# Patient Record
Sex: Male | Born: 2000 | Race: White | Hispanic: No | Marital: Single | Smoking: Never smoker
Health system: Southern US, Community
[De-identification: ages and names within clinical notes are randomized; demographics above are authoritative.]

## PROBLEM LIST (undated history)

## (undated) DIAGNOSIS — J45909 Unspecified asthma, uncomplicated: Secondary | ICD-10-CM

## (undated) HISTORY — PX: NO PAST SURGERIES: SHX2092

## (undated) HISTORY — DX: Unspecified asthma, uncomplicated: J45.909

---
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
## 2022-02-17 ENCOUNTER — Other Ambulatory Visit: Payer: Self-pay

## 2022-02-17 ENCOUNTER — Encounter (INDEPENDENT_AMBULATORY_CARE_PROVIDER_SITE_OTHER): Payer: 59 | Admitting: Sports Medicine

## 2022-02-17 DIAGNOSIS — F411 Generalized anxiety disorder: Secondary | ICD-10-CM

## 2022-02-17 MED ORDER — SERTRALINE HCL 100 MG PO TABS: 100.0000 mg | ORAL_TABLET | Freq: Every day | ORAL | 3 refills | Status: DC

## 2022-02-17 MED ORDER — SERTRALINE HCL 100 MG PO TABS: 100.0000 mg | ORAL_TABLET | Freq: Every day | ORAL | 0 refills | Status: DC

## 2022-02-17 NOTE — Telephone Encounter (Signed)
I spent 5 total minutes of online digital evaluation and management services in this patient-initiated request for online care. 

## 2022-02-19 ENCOUNTER — Ambulatory Visit: Payer: Self-pay | Admitting: Sports Medicine

## 2022-03-03 ENCOUNTER — Ambulatory Visit: Payer: 59 | Admitting: Sports Medicine

## 2022-03-03 ENCOUNTER — Encounter: Payer: Self-pay | Admitting: Sports Medicine

## 2022-03-03 VITALS — BP 137/89 | HR 81 | Ht 71.0 in

## 2022-03-03 DIAGNOSIS — Z0000 Encounter for general adult medical examination without abnormal findings: Secondary | ICD-10-CM | POA: Diagnosis not present

## 2022-03-03 DIAGNOSIS — J4540 Moderate persistent asthma, uncomplicated: Secondary | ICD-10-CM

## 2022-03-03 DIAGNOSIS — F411 Generalized anxiety disorder: Secondary | ICD-10-CM

## 2022-03-03 MED ORDER — FLUTICASONE PROPIONATE HFA 110 MCG/ACT IN AERO: 2.0000 | INHALATION_SPRAY | Freq: Two times a day (BID) | RESPIRATORY_TRACT | 3 refills | Status: AC

## 2022-03-03 MED ORDER — SERTRALINE HCL 100 MG PO TABS: 100.0000 mg | ORAL_TABLET | Freq: Every day | ORAL | 3 refills | Status: DC

## 2022-03-03 MED ORDER — ALBUTEROL SULFATE HFA 108 (90 BASE) MCG/ACT IN AERS: INHALATION_SPRAY | RESPIRATORY_TRACT | 11 refills | Status: DC

## 2022-03-03 NOTE — Assessment & Plan Note (Signed)
Marcelle and will return early December 2024 for fasting annual physical.

## 2022-03-03 NOTE — Assessment & Plan Note (Addendum)
Please see prior notes, Brian Garner is doing very well, he is well-controlled on Zoloft 100, no side effects, I am happy to refill it for a year, he can return to see me in a year.

## 2022-03-03 NOTE — Assessment & Plan Note (Signed)
Stable and well-controlled, refilling medication. 

## 2022-03-03 NOTE — Progress Notes (Signed)
    Procedures performed today:    None.  Independent interpretation of notes and tests performed by another provider:   None.  Brief History, Exam, Impression, and Recommendations:    Generalized anxiety disorder Please see prior notes, Brian Garner is doing very well, he is well-controlled on Zoloft 100, no side effects, I am happy to refill it for a year, he can return to see me in a year.  Moderate persistent asthma Stable and well-controlled, refilling medication.  Annual physical exam Brian Garner and will return early December 2024 for fasting annual physical.    ____________________________________________ [name]. [name], M.D., ABFM., CAQSM., AME. Primary Care and Sports Medicine [place] [facility]  Adjunct Professor of Family Medicine  [employer]  Restaurant manager, fast food

## 2022-04-16 ENCOUNTER — Other Ambulatory Visit: Payer: Self-pay | Admitting: Sports Medicine

## 2022-04-16 DIAGNOSIS — J4540 Moderate persistent asthma, uncomplicated: Secondary | ICD-10-CM

## 2022-10-13 ENCOUNTER — Telehealth: Payer: Self-pay | Admitting: General Practice

## 2022-10-13 NOTE — Transitions of Care (Post Inpatient/ED Visit) (Signed)
   10/13/2022  Name: Brian Garner MRN: [id number] DOB: [birth year]  Today's TOC FU Call Status: Today's TOC FU Call Status:: Unsuccessul Call (1st Attempt) Unsuccessful Call (1st Attempt) Date: 10/13/22  Attempted to reach the patient regarding the most recent Inpatient/ED visit.  Follow Up Plan: Additional outreach attempts will be made to reach the patient to complete the Transitions of Care (Post Inpatient/ED visit) call.   Signature [place] Charon, Control and instrumentation engineer

## 2022-10-14 NOTE — Transitions of Care (Post Inpatient/ED Visit) (Signed)
   10/14/2022  Name: Brian Garner MRN: [id number] DOB: 09/20/2000  Today's TOC FU Call Status: Today's TOC FU Call Status:: Unsuccessful Call (2nd Attempt) Unsuccessful Call (1st Attempt) Date: 10/13/22 Unsuccessful Call (2nd Attempt) Date: 10/14/22  Attempted to reach the patient regarding the most recent Inpatient/ED visit.  Follow Up Plan: Additional outreach attempts will be made to reach the patient to complete the Transitions of Care (Post Inpatient/ED visit) call.   Signature [place] Charon, Control and instrumentation engineer

## 2022-10-15 NOTE — Transitions of Care (Post Inpatient/ED Visit) (Signed)
   10/15/2022  Name: Brian Garner MRN: [id number] DOB: 05-23-2000  Today's TOC FU Call Status: Today's TOC FU Call Status:: Unsuccessful Call (3rd Attempt) Unsuccessful Call (1st Attempt) Date: 10/13/22 Unsuccessful Call (2nd Attempt) Date: 10/14/22 Unsuccessful Call (3rd Attempt) Date: 10/15/22  Attempted to reach the patient regarding the most recent Inpatient/ED visit.  Follow Up Plan: No further outreach attempts will be made at this time. We have been unable to contact the patient.  Signature Modesto Charon, Control and instrumentation engineer

## 2022-12-05 ENCOUNTER — Ambulatory Visit: Payer: 59 | Admitting: Sports Medicine

## 2022-12-05 ENCOUNTER — Encounter: Payer: Self-pay | Admitting: Sports Medicine

## 2022-12-05 DIAGNOSIS — F1910 Other psychoactive substance abuse, uncomplicated: Secondary | ICD-10-CM | POA: Diagnosis not present

## 2022-12-05 DIAGNOSIS — F411 Generalized anxiety disorder: Secondary | ICD-10-CM

## 2022-12-05 DIAGNOSIS — F1990 Other psychoactive substance use, unspecified, uncomplicated: Secondary | ICD-10-CM | POA: Insufficient documentation

## 2022-12-05 MED ORDER — SERTRALINE HCL 50 MG PO TABS
150.0000 mg | ORAL_TABLET | Freq: Every day | ORAL | 11 refills | Status: DC
Start: 2022-12-05 — End: 2022-12-30

## 2022-12-05 NOTE — Progress Notes (Signed)
    Procedures performed today:    None.  Independent interpretation of notes and tests performed by another provider:   None.  Brief History, Exam, Impression, and Recommendations:    Generalized anxiety disorder Historically well-controlled on Zoloft 100, increasing to 150, we will do 350 mg tabs daily, he needs titration in about 6 weeks to see how things are going.  Polysubstance use, alcohol, marijuana, cocaine, amphetamines Had an episode where he feels as though his alcohol was laced with drugs, he ended up high, was picked up out of the road and brought to the hospital, he had a chin laceration that was stitched, we removed the stitches today and applied Dermabond. Drug screen was positive for cocaine, amphetamines, marijuana. Alcohol level was 0.06. Rechecking drug screen today.  I spent 30 minutes of total time managing this patient today, this includes chart review, face to face, and non-face to face time.  ____________________________________________ [name]. [name], M.D., ABFM., CAQSM., AME. Primary Care and Sports Medicine [facility]  Adjunct Professor of Family Medicine  [place]  Restaurant manager, fast food

## 2022-12-05 NOTE — Assessment & Plan Note (Signed)
Had an episode where he feels as though his alcohol was laced with drugs, he ended up high, was picked up out of the road and brought to the hospital, he had a chin laceration that was stitched, we removed the stitches today and applied Dermabond. Drug screen was positive for cocaine, amphetamines, marijuana. Alcohol level was 0.06. Rechecking drug screen today.

## 2022-12-05 NOTE — Assessment & Plan Note (Signed)
Historically well-controlled on Zoloft 100, increasing to 150, we will do 350 mg tabs daily, he needs titration in about 6 weeks to see how things are going.

## 2022-12-10 LAB — DRUG SCREEN 12+ALCOHOL+CRT, UR
Amphetamines, Urine: NEGATIVE ng/mL
BENZODIAZ UR QL: NEGATIVE ng/mL
Barbiturate: NEGATIVE ng/mL
Cannabinoids: POSITIVE — AB
Cocaine (Metabolite): NEGATIVE ng/mL
Creatinine, Urine: 144.7 mg/dL (ref 20.0–300.0)
Ethanol, Urine: NEGATIVE %
Meperidine: NEGATIVE ng/mL
Methadone: NEGATIVE ng/mL
OPIATE SCREEN URINE: NEGATIVE ng/mL
Oxycodone/Oxymorphone, Urine: NEGATIVE ng/mL
Phencyclidine: NEGATIVE ng/mL
Propoxyphene: NEGATIVE ng/mL
Tramadol: NEGATIVE ng/mL

## 2022-12-20 LAB — COMPREHENSIVE METABOLIC PANEL
ALT: 50 [IU]/L — ABNORMAL HIGH (ref 0–44)
AST: 41 [IU]/L — ABNORMAL HIGH (ref 0–40)
Albumin: 4.8 g/dL (ref 4.3–5.2)
Alkaline Phosphatase: 86 [IU]/L (ref 51–125)
BUN/Creatinine Ratio: 18 (ref 9–20)
BUN: 17 mg/dL (ref 6–20)
Bilirubin Total: 0.8 mg/dL (ref 0.0–1.2)
CO2: 23 mmol/L (ref 20–29)
Calcium: 9.6 mg/dL (ref 8.7–10.2)
Chloride: 99 mmol/L (ref 96–106)
Creatinine, Ser: 0.95 mg/dL (ref 0.76–1.27)
Globulin, Total: 2.5 g/dL (ref 1.5–4.5)
Glucose: 92 mg/dL (ref 70–99)
Potassium: 4.1 mmol/L (ref 3.5–5.2)
Sodium: 138 mmol/L (ref 134–144)
Total Protein: 7.3 g/dL (ref 6.0–8.5)
eGFR: 118 mL/min/{1.73_m2} (ref >59)

## 2022-12-20 LAB — THC,MS,WB/SP RFX
Cannabidiol: NEGATIVE ng/mL
Cannabinoid Confirmation: POSITIVE
Tetrahydrocannabinol(THC): 3.6 ng/mL

## 2022-12-20 LAB — CBC
Hematocrit: 49.4 % (ref 37.5–51.0)
Hemoglobin: 16.6 g/dL (ref 13.0–17.7)
MCH: 30.6 pg (ref 26.6–33.0)
MCHC: 33.6 g/dL (ref 31.5–35.7)
MCV: 91 fL (ref 79–97)
Platelets: 284 10*3/uL (ref 150–450)
RBC: 5.43 x10E6/uL (ref 4.14–5.80)
RDW: 13.1 % (ref 11.6–15.4)
WBC: 11.6 10*3/uL — ABNORMAL HIGH (ref 3.4–10.8)

## 2022-12-20 LAB — DRUG SCREEN 13 W/CONF , SERUM
Amphetamines, IA: NEGATIVE ng/mL
Barbiturates, IA: NEGATIVE ug/mL
Benzodiazepines, IA: NEGATIVE ng/mL
Cocaine & Metabolite, IA: NEGATIVE ng/mL
FENTANYL, IA: NEGATIVE ng/mL
MEPERIDINE, IA: NEGATIVE ng/mL
Methadone, IA: NEGATIVE ng/mL
Opiates, IA: NEGATIVE ng/mL
Oxycodones, IA: NEGATIVE ng/mL
Phencyclidine, IA: NEGATIVE ng/mL
Propoxyphene, IA: NEGATIVE ng/mL
THC(Marijuana) Metabolite, IA: POSITIVE ng/mL — AB
TRAMADOL, IA: NEGATIVE ng/mL

## 2022-12-30 ENCOUNTER — Other Ambulatory Visit: Payer: Self-pay | Admitting: Sports Medicine

## 2022-12-30 DIAGNOSIS — F411 Generalized anxiety disorder: Secondary | ICD-10-CM

## 2023-01-16 ENCOUNTER — Ambulatory Visit: Payer: 59 | Admitting: Sports Medicine

## 2023-03-05 ENCOUNTER — Encounter: Payer: Self-pay | Admitting: Sports Medicine

## 2023-03-05 ENCOUNTER — Ambulatory Visit (INDEPENDENT_AMBULATORY_CARE_PROVIDER_SITE_OTHER): Payer: 59 | Admitting: Sports Medicine

## 2023-03-05 VITALS — BP 125/67 | HR 66 | Ht 71.0 in | Wt 225.0 lb

## 2023-03-05 DIAGNOSIS — J4540 Moderate persistent asthma, uncomplicated: Secondary | ICD-10-CM

## 2023-03-05 DIAGNOSIS — Z0000 Encounter for general adult medical examination without abnormal findings: Secondary | ICD-10-CM

## 2023-03-05 DIAGNOSIS — F411 Generalized anxiety disorder: Secondary | ICD-10-CM

## 2023-03-05 MED ORDER — ALBUTEROL SULFATE HFA 108 (90 BASE) MCG/ACT IN AERS
INHALATION_SPRAY | RESPIRATORY_TRACT | 11 refills | Status: DC
Start: 2023-03-05 — End: 2023-03-23

## 2023-03-05 NOTE — Progress Notes (Signed)
Subjective:    CC: Annual Physical Exam  HPI:  This patient is here for their annual physical  I reviewed the past medical history, family history, social history, surgical history, and allergies today and no changes were needed.  Please see the problem list section below in epic for further details.  Past Medical History: Past Medical History:  Diagnosis Date   Asthma    Past Surgical History: Past Surgical History:  Procedure Laterality Date   NO PAST SURGERIES     Social History: Social History   Socioeconomic History   Marital status: Single    Spouse name: Not on file   Number of children: Not on file   Years of education: Not on file   Highest education level: Not on file  Occupational History   Not on file  Tobacco Use   Smoking status: Never   Smokeless tobacco: Never  Substance and Sexual Activity   Alcohol use: Not Currently   Drug use: Not Currently   Sexual activity: Yes    Birth control/protection: Condom  Other Topics Concern   Not on file  Social History Narrative   Not on file   Social Drivers of Health   Financial Resource Strain: Not on file  Food Insecurity: Not on file  Transportation Needs: Not on file  Physical Activity: Not on file  Stress: Not on file  Social Connections: Unknown (10/10/2022)   [facility]   Social Network    Social Network: Not on file   Family History: Family History  Problem Relation Age of Onset   Hashimoto's thyroiditis Mother    Gout Father    Cancer Maternal Grandmother        NON HODGKINS LYMPHOMA   Cancer Maternal Grandfather        LEUKEMIA   Atrial fibrillation Paternal Grandfather    Allergies: No Known Allergies Medications: See med rec.  Review of Systems: No headache, visual changes, nausea, vomiting, diarrhea, constipation, dizziness, abdominal pain, skin rash, fevers, chills, night sweats, swollen lymph nodes, weight loss, chest pain, body aches, joint swelling, muscle  aches, shortness of breath, mood changes, visual or auditory hallucinations.  Objective:    General: Well Developed, well nourished, and in no acute distress.  Neuro: Alert and oriented x3, extra-ocular muscles intact, sensation grossly intact. Cranial nerves II through XII are intact, motor, sensory, and coordinative functions are all intact. HEENT: Normocephalic, atraumatic, pupils equal round reactive to light, neck supple, no masses, no lymphadenopathy, thyroid nonpalpable. Oropharynx, nasopharynx unremarkable, bilateral cerumen impactions but no symptoms so we will watch this for now.   Skin: Warm and dry, no rashes noted.  Cardiac: Regular rate and rhythm, no murmurs rubs or gallops.  Respiratory: Clear to auscultation bilaterally. Not using accessory muscles, speaking in full sentences.  Abdominal: Soft, nontender, nondistended, positive bowel sounds, no masses, no organomegaly.  Musculoskeletal: Shoulder, elbow, wrist, hip, knee, ankle stable, and with full range of motion.  Impression and Recommendations:    The patient was counselled, risk factors were discussed, anticipatory guidance given.  Annual physical exam Brian Garner returns, he is doing really well from a physical and a mood standpoint. He has been doing a lot of working out in [employer] Annual physical as above, declines vaccinations. We will get lab work every 2 years. Return to see me in a year. He should come fasting so we can get blood work.  Generalized anxiety disorder Under excellent control now on Zoloft 150. No changes  needed.   ____________________________________________ [name]. [name], M.D., ABFM., CAQSM., AME. Primary Care and Sports Medicine [facility]  Adjunct Professor of Family Medicine  [place] [employer] [facility] [employer] [facility] [employer]  Restaurant manager, fast food

## 2023-03-05 NOTE — Assessment & Plan Note (Addendum)
Brian Garner returns, he is doing really well from a physical and a mood standpoint. He has been doing a lot of working out in [employer] Annual physical as above, declines vaccinations. We will get lab work every 2 years. Return to see me in a year. He should come fasting so we can get blood work.

## 2023-03-05 NOTE — Assessment & Plan Note (Signed)
Under excellent control now on Zoloft 150. No changes needed.

## 2023-03-23 ENCOUNTER — Other Ambulatory Visit: Payer: Self-pay | Admitting: Sports Medicine

## 2023-03-23 DIAGNOSIS — J4540 Moderate persistent asthma, uncomplicated: Secondary | ICD-10-CM

## 2023-03-26 ENCOUNTER — Telehealth: Payer: Self-pay | Admitting: Family Medicine

## 2023-03-26 MED ORDER — OSELTAMIVIR PHOSPHATE 75 MG PO CAPS: 75.0000 mg | ORAL_CAPSULE | Freq: Every day | ORAL | 0 refills | Status: DC

## 2023-03-26 NOTE — Telephone Encounter (Signed)
 2 family members pos for flu this week.  Hx of asthma. Will send in proplylactic tamiflu    Meds ordered this encounter  Medications   oseltamivir  (TAMIFLU ) 75 MG capsule    Sig: Take 1 capsule (75 mg total) by mouth daily.    Dispense:  10 capsule    Refill:  0

## 2023-07-06 ENCOUNTER — Ambulatory Visit: Admitting: Family Medicine

## 2023-07-06 ENCOUNTER — Encounter: Payer: Self-pay | Admitting: Family Medicine

## 2023-07-06 VITALS — BP 130/76 | HR 68 | Temp 98.1°F | Ht 71.0 in | Wt 223.0 lb

## 2023-07-06 DIAGNOSIS — B349 Viral infection, unspecified: Secondary | ICD-10-CM | POA: Insufficient documentation

## 2023-07-06 DIAGNOSIS — R6889 Other general symptoms and signs: Secondary | ICD-10-CM | POA: Diagnosis not present

## 2023-07-06 LAB — POC COVID19 BINAXNOW: SARS Coronavirus 2 Ag: NEGATIVE

## 2023-07-06 LAB — POCT INFLUENZA A/B
Influenza A, POC: NEGATIVE
Influenza B, POC: NEGATIVE

## 2023-07-06 NOTE — Progress Notes (Signed)
 Brian Garner - 23 y.o. male MRN [id number]  Date of birth: [birth year]  Subjective Chief Complaint  Patient presents with   Cough   Nasal Congestion   Fever    HPI Brian Garner is a 23 y.o. male here today with complaint of dry cough, nausea, vomiting, diarrhea, body aches and headache.  Symptoms started about 4 days ago as fever and vomiting.  Other symptoms came a couple of days later.  Denies dyspnea, chest pain, blood in his stool or sever abdominal pain.  Symptoms have improved some today compared to yesterday.    ROS:  A comprehensive ROS was completed and negative except as noted per HPI  Allergies  Allergen Reactions   Dog Epithelium (Canis Lupus Familiaris) Itching    Past Medical History:  Diagnosis Date   Asthma     Past Surgical History:  Procedure Laterality Date   NO PAST SURGERIES      Social History   Socioeconomic History   Marital status: Single    Spouse name: Not on file   Number of children: Not on file   Years of education: Not on file   Highest education level: Not on file  Occupational History   Not on file  Tobacco Use   Smoking status: Never   Smokeless tobacco: Never  Substance and Sexual Activity   Alcohol  use: Not Currently   Drug use: Not Currently   Sexual activity: Yes    Birth control/protection: Condom  Other Topics Concern   Not on file  Social History Narrative   Not on file   Social Drivers of Health   Financial Resource Strain: Not on file  Food Insecurity: Not on file  Transportation Needs: Not on file  Physical Activity: Not on file  Stress: Not on file  Social Connections: Unknown (10/10/2022)   Received from [facility]   Social Network    Social Network: Not on file    Family History  Problem Relation Age of Onset   Hashimoto's thyroiditis Mother    Gout Father    Cancer Maternal Grandmother        NON HODGKINS LYMPHOMA   Cancer Maternal Grandfather        LEUKEMIA   Atrial fibrillation Paternal  Grandfather     Health Maintenance  Topic Date Due   Pneumococcal Vaccine 91-77 Years old (1 of 1 - PPSV23) 05/15/2008   HPV VACCINES (1 - Male 3-dose series) Never done   COVID-19 Vaccine (1 - 2024-25 season) Never done   INFLUENZA VACCINE  10/16/2023   DTaP/Tdap/Td (7 - Td or Tdap) 09/20/2024   Hepatitis C Screening  Completed   HIV Screening  Completed   Meningococcal B Vaccine  Completed     ----------------------------------------------------------------------------------------------------------------------------------------------------------------------------------------------------------------- Physical Exam BP 130/76 (BP Location: Right Arm, Patient Position: Sitting, Cuff Size: Large)   Pulse 68   Temp 98.1 F (36.7 C) (Oral)   Ht 5\' 11"  (1.803 m)   Wt 223 lb (101.2 kg)   SpO2 98%   BMI 31.10 kg/m   Physical Exam Constitutional:      Appearance: Normal appearance.  Eyes:     General: No scleral icterus. Cardiovascular:     Rate and Rhythm: Normal rate and regular rhythm.  Pulmonary:     Effort: Pulmonary effort is normal.     Breath sounds: Normal breath sounds.  Abdominal:     General: Bowel sounds are normal. There is no distension.     Palpations: Abdomen is soft.  Tenderness: There is no abdominal tenderness.  Neurological:     Mental Status: He is alert.  Psychiatric:        Mood and Affect: Mood normal.        Behavior: Behavior normal.     ------------------------------------------------------------------------------------------------------------------------------------------------------------------------------------------------------------------- Assessment and Plan  Viral syndrome Fever and symptoms are improving. Likely viral etiology.  Provided note for work,  Encouraged increased fluids with electrolytes.  Red flags reviewed.    No orders of the defined types were placed in this encounter.   No follow-ups on file.

## 2023-07-06 NOTE — Assessment & Plan Note (Signed)
 Fever and symptoms are improving. Likely viral etiology.  Provided note for work,  Encouraged increased fluids with electrolytes.  Red flags reviewed.

## 2023-07-25 IMAGING — DX DG ANKLE COMPLETE 3+V*L*
3 series · 3 of 3 positions shown · non-contrast
Comparison: None Available.

CLINICAL DATA: Lateral left ankle pain. Swelling after inversion
injury weeks ago.

EXAM:
LEFT ANKLE COMPLETE - 3+ VIEW

[ankle ap]
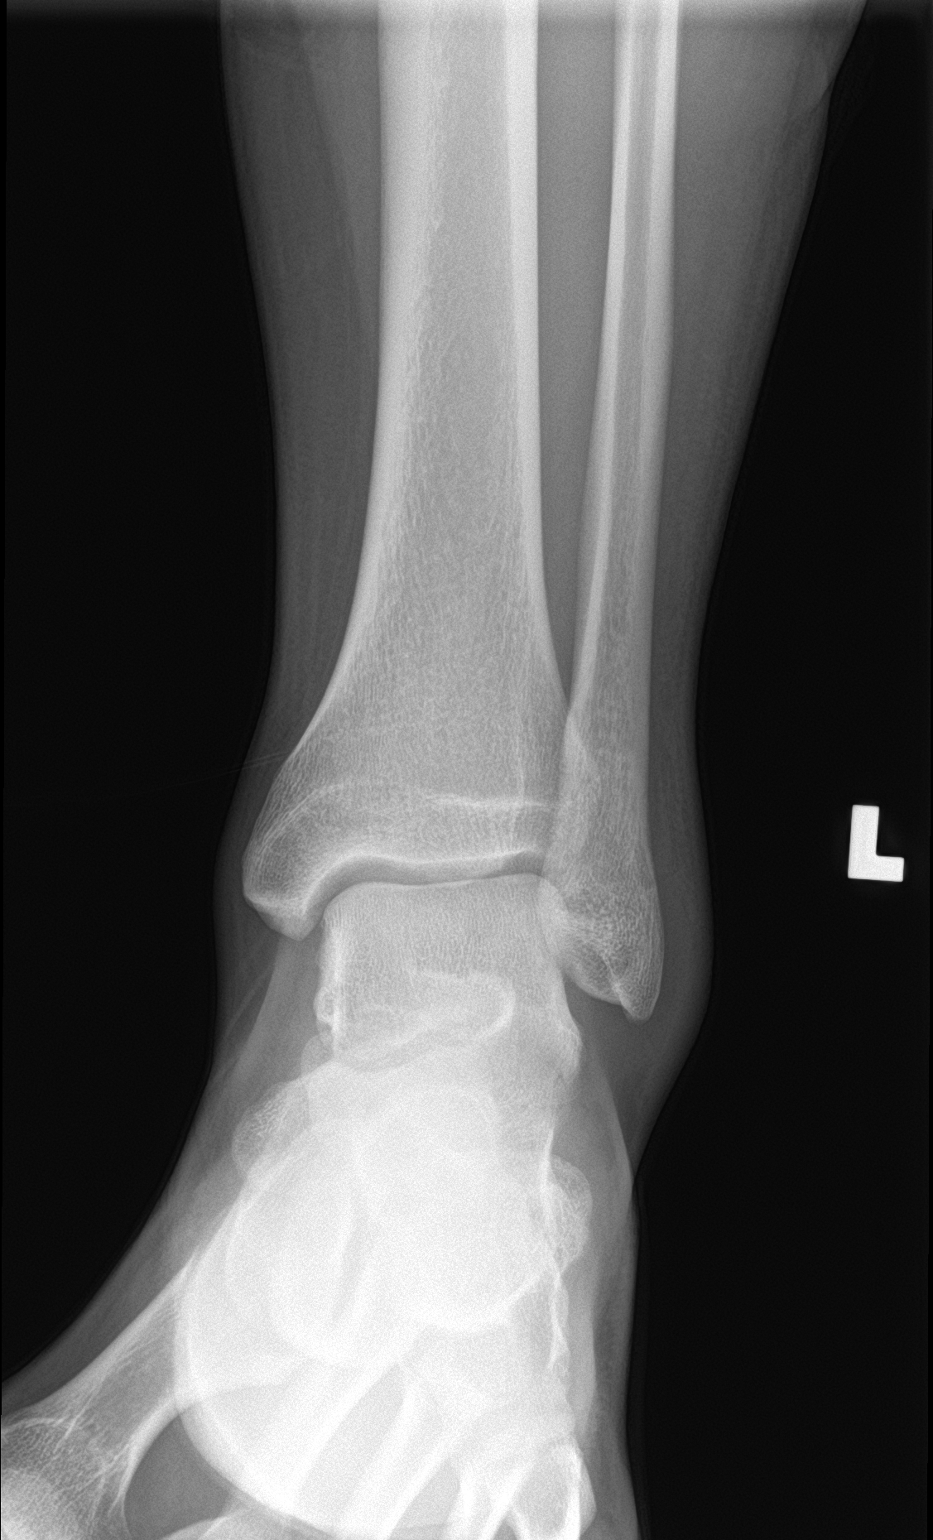

[ankle obl]
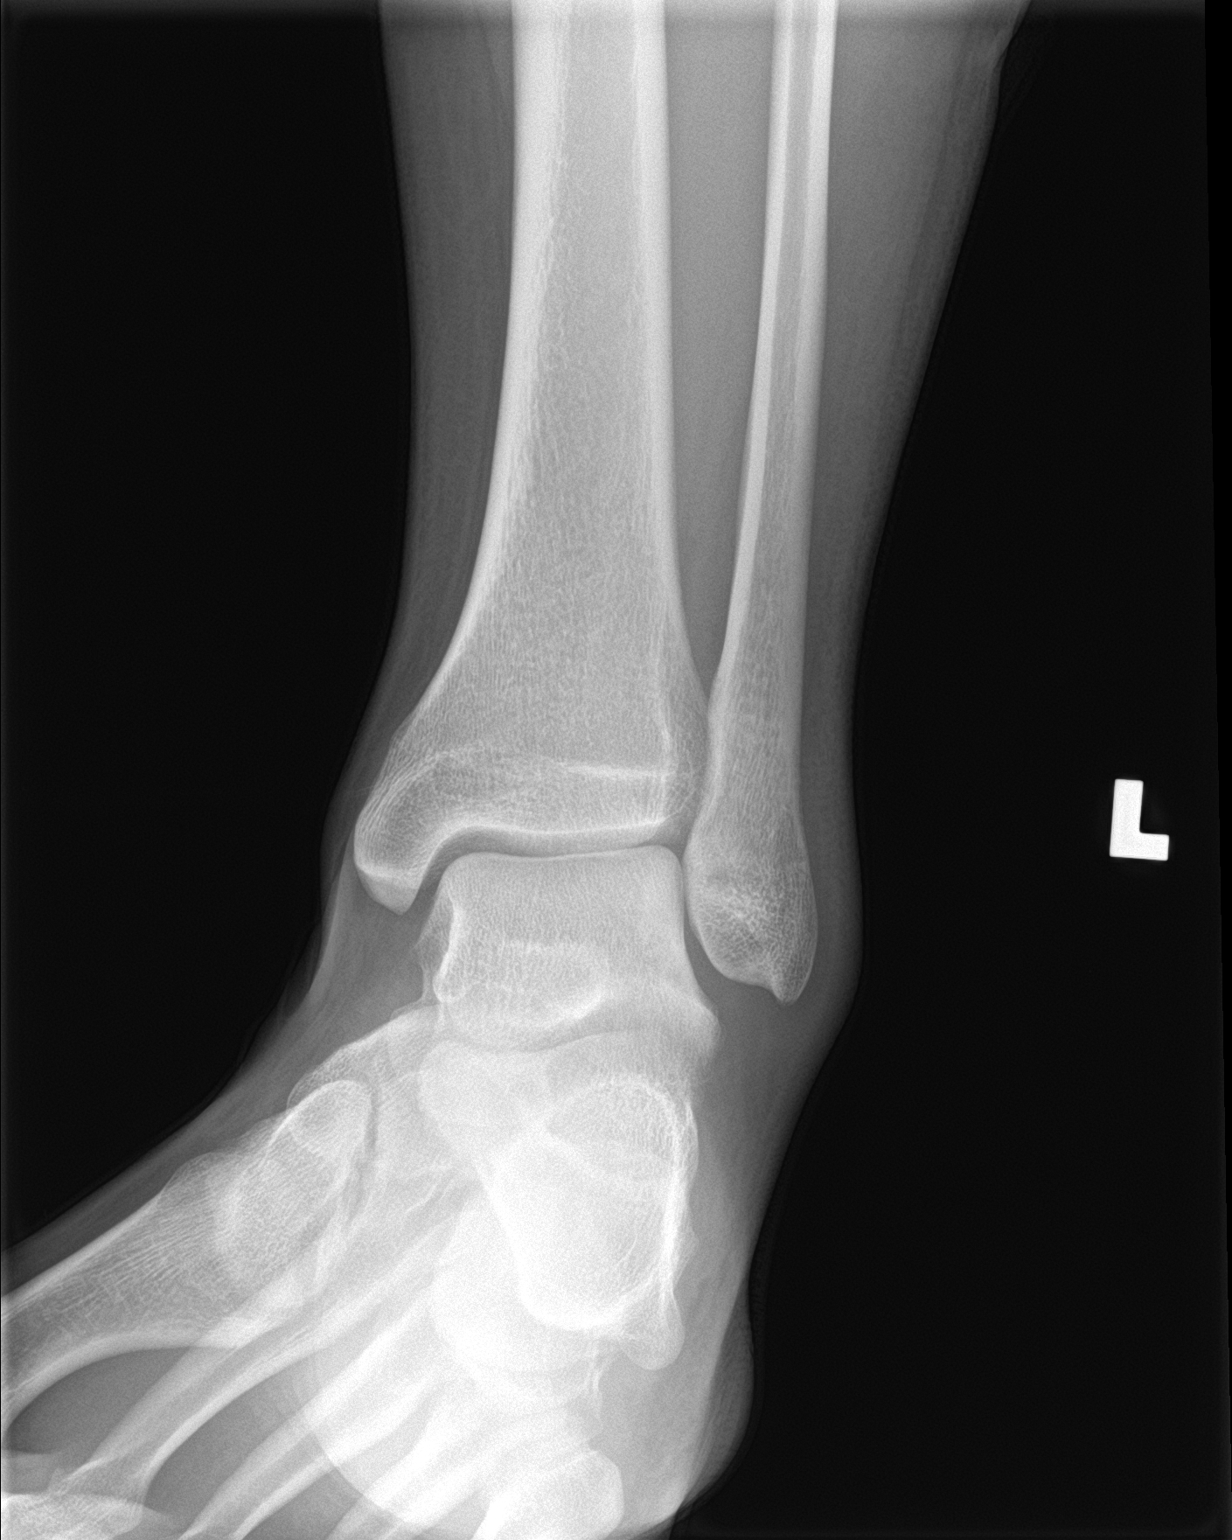

[ankle lat]
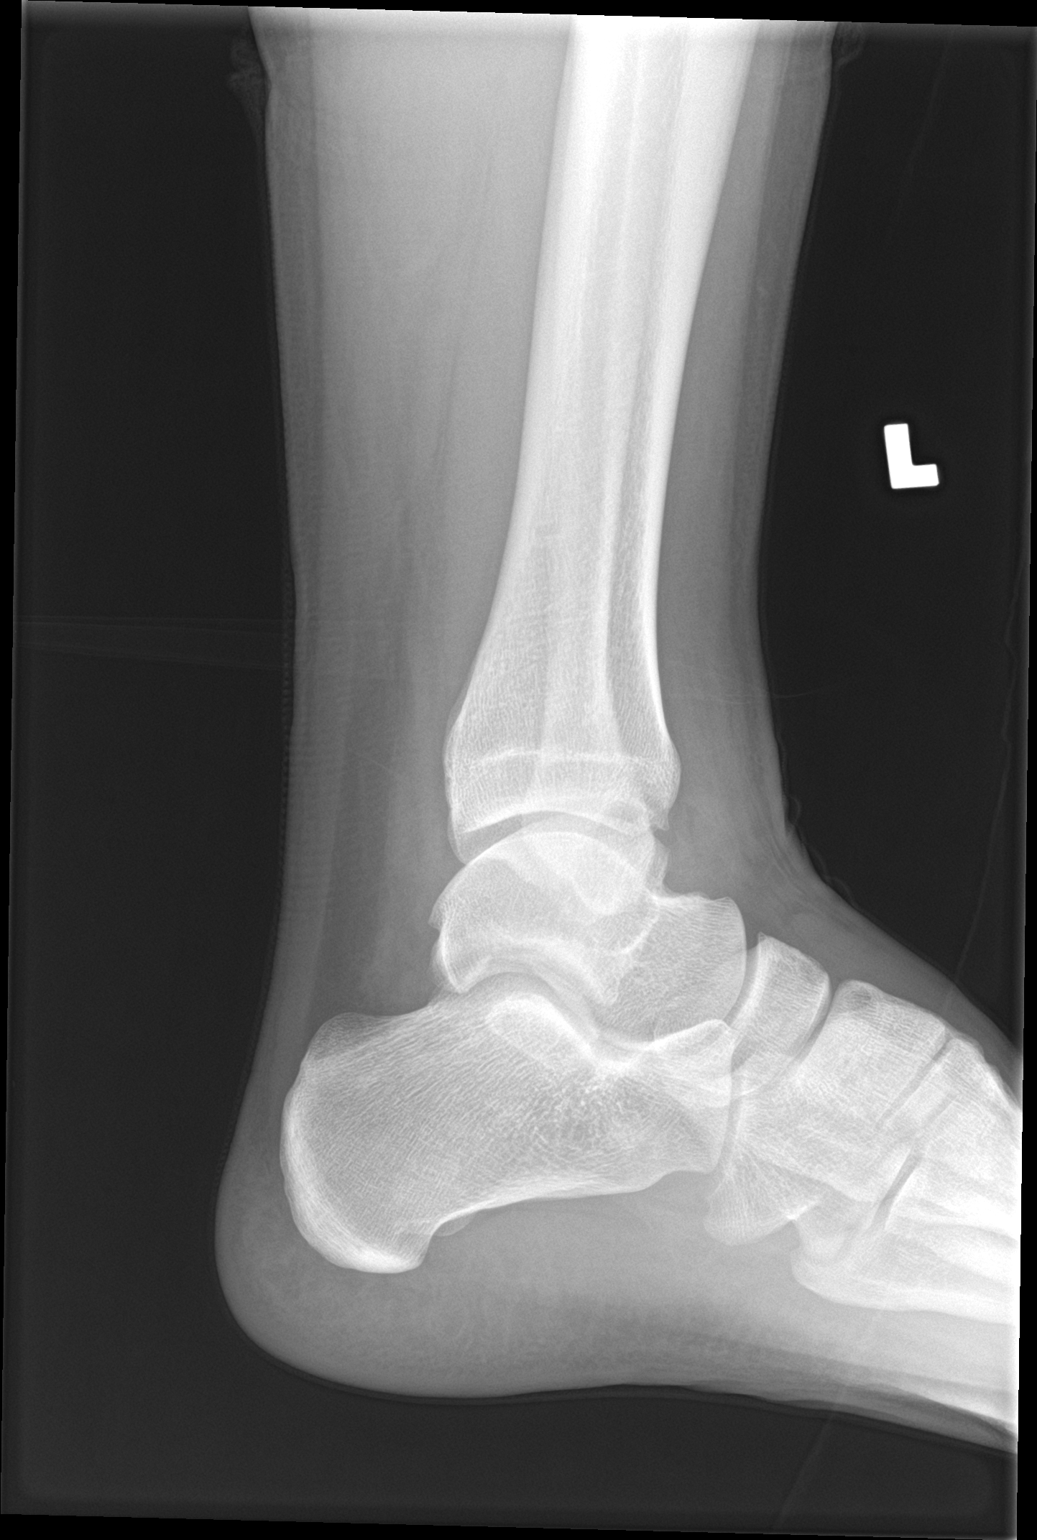

[3 of 3 positions shown; findings below may reference images not displayed]

FINDINGS: The ankle mortise is symmetric and intact. Minimal lateral malleolar
soft tissue swelling. No acute fracture is seen. No dislocation.
IMPRESSION: Minimal lateral malleolar soft tissue swelling.  No acute fracture.

## 2023-11-17 ENCOUNTER — Encounter: Payer: Self-pay | Admitting: Sports Medicine

## 2024-01-14 ENCOUNTER — Telehealth (HOSPITAL_BASED_OUTPATIENT_CLINIC_OR_DEPARTMENT_OTHER): Payer: Self-pay

## 2024-01-14 NOTE — Telephone Encounter (Signed)
 Attempted to contact patient in regards to medication refill. I was able to speak to patients mother, Sarp Vernier, per DPR patient consented to release information to mother. I have advised her we have received a medication refill request that we are unable to fill. Advised that pt needs to establish with a new PCP for further refills and also gave a few recommendations on PCP's accepting new patients at the moment.
